# Patient Record
Sex: Male | Born: 1950 | Race: White | Hispanic: No | Marital: Married | Smoking: Former smoker
Health system: Southern US, Community
[De-identification: ages and names within clinical notes are randomized; demographics above are authoritative.]

## PROBLEM LIST (undated history)

## (undated) DIAGNOSIS — R269 Unspecified abnormalities of gait and mobility: Secondary | ICD-10-CM

## (undated) DIAGNOSIS — M542 Cervicalgia: Secondary | ICD-10-CM

## (undated) DIAGNOSIS — R51 Headache: Secondary | ICD-10-CM

## (undated) DIAGNOSIS — M545 Low back pain, unspecified: Secondary | ICD-10-CM

## (undated) DIAGNOSIS — F32A Depression, unspecified: Secondary | ICD-10-CM

## (undated) DIAGNOSIS — D649 Anemia, unspecified: Secondary | ICD-10-CM

## (undated) DIAGNOSIS — K599 Functional intestinal disorder, unspecified: Secondary | ICD-10-CM

## (undated) DIAGNOSIS — F419 Anxiety disorder, unspecified: Secondary | ICD-10-CM

## (undated) DIAGNOSIS — G8929 Other chronic pain: Secondary | ICD-10-CM

## (undated) DIAGNOSIS — R413 Other amnesia: Secondary | ICD-10-CM

## (undated) DIAGNOSIS — H9313 Tinnitus, bilateral: Secondary | ICD-10-CM

## (undated) DIAGNOSIS — H9192 Unspecified hearing loss, left ear: Secondary | ICD-10-CM

## (undated) DIAGNOSIS — M19079 Primary osteoarthritis, unspecified ankle and foot: Secondary | ICD-10-CM

## (undated) DIAGNOSIS — F329 Major depressive disorder, single episode, unspecified: Secondary | ICD-10-CM

## (undated) DIAGNOSIS — M1990 Unspecified osteoarthritis, unspecified site: Secondary | ICD-10-CM

## (undated) DIAGNOSIS — R410 Disorientation, unspecified: Secondary | ICD-10-CM

## (undated) HISTORY — DX: Depression, unspecified: F32.A

## (undated) HISTORY — PX: OTHER SURGICAL HISTORY: SHX169

## (undated) HISTORY — DX: Anemia, unspecified: D64.9

## (undated) HISTORY — DX: Low back pain: M54.5

## (undated) HISTORY — DX: Other chronic pain: G89.29

## (undated) HISTORY — DX: Tinnitus, bilateral: H93.13

## (undated) HISTORY — DX: Low back pain, unspecified: M54.50

## (undated) HISTORY — DX: Headache: R51

## (undated) HISTORY — DX: Unspecified hearing loss, left ear: H91.92

## (undated) HISTORY — DX: Unspecified abnormalities of gait and mobility: R26.9

## (undated) HISTORY — DX: Anxiety disorder, unspecified: F41.9

## (undated) HISTORY — DX: Other amnesia: R41.3

## (undated) HISTORY — DX: Major depressive disorder, single episode, unspecified: F32.9

## (undated) HISTORY — DX: Disorientation, unspecified: R41.0

## (undated) HISTORY — DX: Cervicalgia: M54.2

## (undated) HISTORY — DX: Unspecified osteoarthritis, unspecified site: M19.90

## (undated) HISTORY — DX: Functional intestinal disorder, unspecified: K59.9

## (undated) HISTORY — DX: Primary osteoarthritis, unspecified ankle and foot: M19.079

---

## 2006-07-22 ENCOUNTER — Ambulatory Visit: Payer: Self-pay | Admitting: Cardiology

## 2011-06-05 ENCOUNTER — Ambulatory Visit: Payer: BC Managed Care – PPO | Attending: Otolaryngology | Admitting: Audiology

## 2011-06-05 DIAGNOSIS — H903 Sensorineural hearing loss, bilateral: Secondary | ICD-10-CM | POA: Insufficient documentation

## 2011-09-03 ENCOUNTER — Ambulatory Visit (INDEPENDENT_AMBULATORY_CARE_PROVIDER_SITE_OTHER): Payer: BC Managed Care – PPO | Admitting: Psychology

## 2011-09-03 DIAGNOSIS — R413 Other amnesia: Secondary | ICD-10-CM

## 2011-09-03 DIAGNOSIS — F09 Unspecified mental disorder due to known physiological condition: Secondary | ICD-10-CM

## 2011-09-03 DIAGNOSIS — F32A Depression, unspecified: Secondary | ICD-10-CM

## 2011-09-03 DIAGNOSIS — F329 Major depressive disorder, single episode, unspecified: Secondary | ICD-10-CM

## 2011-09-24 ENCOUNTER — Encounter (HOSPITAL_COMMUNITY): Payer: Self-pay | Admitting: Psychology

## 2011-09-24 NOTE — Progress Notes (Signed)
Patient:   Kerry Ward   DOB:   [birth year]  MR Number:  [id number]  Location:  [facility]  [address] [place] [place], [place], [address]  Date of Service:   09/03/2011  Start Time:   3 PM End Time:   4 PM  Provider/Observer:  Arley Phenix, Psy.D.  Billing Code/Service:  [id number]  Reason for Service:  The patient was referred by [facility] and specifically Dr. Terrace Arabia for neuropsychological assessment. The patient is a 60 year old right-handed Caucasian male, presenting with 9 month history of gradual worsening of memory functioning, confusion, and difficulty concentrating. There is significant family stress as well as work related stress, depression, anxiety, but a normal mental status exam. There is a differential diagnostic considerations of a degenerative central nervous system condition versus depression versus combination of both. He was referred for a psychological evaluation. He is also scheduled to have an MRI of the brain but I do not have these records at this point.  The patient reports that 8 or 9 years ago that he left the country while working at [employer]. He reports he took some shots for this overseas trip and ever since his body reacts to everything. He has been diagnosed with a number of conditions to the years including multiple can chemical sensitivity and other problems related to this reactive situation. The patient reports that he has tinnitus so bad that he is losing his hearing in his left ear and feels dizziness all the time. The patient reports that his life has been so significantly impacted that he is depressed all the time and feels like doing nothing.  Current Status:  The patient reports continued difficulty with memory issues. He reports that he is essentially allergic to everything and cannot go anywhere in his life without having problems. Patient reports that he has allergic reactions to grass, trees, corn, weak, and despite every  other thing he is exposed to. The patient reports he moved here and got a job with 3 metallic chronic but has not been able to do these things because of this issue. 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 The patient reports that he does not seem nearly as many problems but his family member reports that there are some significant memory loss.   Behavioral Observation: Kerry Ward  presents as a 60 y.o.-year-old Right Caucasian  Male who appeared his stated age. his dress was Appropriate and he was Well Groomed and his manners were Appropriate to the situation.  There were not any physical disabilities noted.  he displayed an appropriate level of cooperation and motivation.    Interactions:    Active   Reliability of Information: Reliable information  Attention:   within normal limits  Memory:   within normal limits  Visuo-spatial:   within normal limits  Speech (Volume):  normal  Speech:   The patient's speech appeared to be articulate and was able to accurately communicate what he was attempting to describe.  Thought Process:  Coherent  Though Content:  WNL  Orientation:   person, place, time/date and situation  Judgment:   Good  Planning:   Good  Affect:    Depressed  Mood:    Depressed  Insight:    Good  Intelligence:   high  Marital Status/Living:  The patient lives at home with his wife. The patient has also lived with his wife and son. The patient was born in  [place] and grew up in [place]. He does have some siblings. His mother died of sepsis and his father died of a heart attack. Patient has been married to one woman and continues to be married to her. They have a 82 year old son and a 41 year old daughter.  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  Past Employment:  He worked for 45 years prior to this with never having any work related issues.  He has worked for motor all in the past. The patient reports that he is having some struggle with his current employment as he is having trouble with forgetfulness, slowed thinking, and been more careless.  Substance Use:  No concerns of substance abuse are reported.    Education:   College  Medical History:   Past Medical History  Diagnosis Date  . Depression   . Anxiety   . Anemia   . Arthritis   . Bowel dysfunction   . Headache   . Abnormality of gait   . Memory loss   . Confusion   . Lower back pain   . Neck pain, chronic     Sexual History:  The patient has been married for many years.  Abuse/Trauma History: There is no indication of any history of abuse or trauma.  Psychiatric History:  The patient does report that he saw someone while living in [place] for psychological issues but does not know who it was. He was treated with Cymbalta.  Family Med/Psych History: History reviewed. No pertinent family history.  Risk of Suicide/Violence: virtually non-existent   Impression/DX:  At this point, the patient does have persistent complaints of some memory difficulties but his family is reporting even more problems with memory, concentration, and problem solving. The patient also has issues related to apparent allergic reactions and possible multiple chemical sensitivities which he dates back to a shot he received 8 or 9 years ago when he was traveling out of the country while working at [employer]. The patient describes allergic reactions to almost every type of environmental allergens including grass, trees, corn, and weak. The patient reports a because of this he has difficulty going anywhere. The patient also reports constant tinnitus in between these cognitive difficulties, apparent allergic reactions, and tinnitus the patient is essentially left to do nothing but work whenever he can. He is essentially tearful about the situation he is in and concern that he may have some more  significant neurological condition as his mother was diagnosed with Alzheimer's at 62 years of age.  Disposition/Plan:  We will set the patient up for formal neuropsychological testing.  Diagnosis:    Axis I:   1. Cognitive disorder   2. Memory loss   3. Depression     Second  Axis I Dx if one:   Axis I: Rule out Organic Affective Syndrome        Axis II: No diagnosis       Axis III:  The patient has significant problems with tinnitus, apparent allergic reactions, and other medical issues.      Axis IV:  economic problems, occupational problems and other psychosocial or environmental problems          Axis V:  41-50 serious symptoms

## 2013-02-04 ENCOUNTER — Ambulatory Visit (HOSPITAL_COMMUNITY)
Admission: RE | Admit: 2013-02-04 | Discharge: 2013-02-04 | Disposition: A | Discharge status: Home / Self Care | Payer: Disability Insurance | Source: Ambulatory Visit | Attending: Radiology | Admitting: Radiology

## 2013-02-04 ENCOUNTER — Other Ambulatory Visit (HOSPITAL_COMMUNITY): Payer: Self-pay | Admitting: *Deleted

## 2013-02-04 ENCOUNTER — Ambulatory Visit (HOSPITAL_COMMUNITY)
Admission: RE | Admit: 2013-02-04 | Discharge: 2013-02-04 | Disposition: A | Discharge status: Home / Self Care | Payer: Disability Insurance | Source: Ambulatory Visit | Attending: *Deleted | Admitting: *Deleted

## 2013-02-04 DIAGNOSIS — M542 Cervicalgia: Secondary | ICD-10-CM | POA: Insufficient documentation

## 2013-02-04 DIAGNOSIS — G8929 Other chronic pain: Secondary | ICD-10-CM | POA: Insufficient documentation

## 2013-02-04 DIAGNOSIS — M545 Low back pain, unspecified: Secondary | ICD-10-CM | POA: Insufficient documentation

## 2017-06-13 DIAGNOSIS — M542 Cervicalgia: Secondary | ICD-10-CM | POA: Diagnosis not present

## 2017-06-13 DIAGNOSIS — M546 Pain in thoracic spine: Secondary | ICD-10-CM | POA: Diagnosis not present

## 2017-06-24 DIAGNOSIS — M542 Cervicalgia: Secondary | ICD-10-CM | POA: Diagnosis not present

## 2017-06-24 DIAGNOSIS — M546 Pain in thoracic spine: Secondary | ICD-10-CM | POA: Diagnosis not present

## 2017-06-25 DIAGNOSIS — M546 Pain in thoracic spine: Secondary | ICD-10-CM | POA: Diagnosis not present

## 2017-06-25 DIAGNOSIS — M542 Cervicalgia: Secondary | ICD-10-CM | POA: Diagnosis not present

## 2017-07-03 DIAGNOSIS — M542 Cervicalgia: Secondary | ICD-10-CM | POA: Diagnosis not present

## 2017-07-03 DIAGNOSIS — M546 Pain in thoracic spine: Secondary | ICD-10-CM | POA: Diagnosis not present

## 2017-07-07 DIAGNOSIS — M546 Pain in thoracic spine: Secondary | ICD-10-CM | POA: Diagnosis not present

## 2017-07-07 DIAGNOSIS — M542 Cervicalgia: Secondary | ICD-10-CM | POA: Diagnosis not present

## 2017-07-09 DIAGNOSIS — M546 Pain in thoracic spine: Secondary | ICD-10-CM | POA: Diagnosis not present

## 2017-07-09 DIAGNOSIS — M542 Cervicalgia: Secondary | ICD-10-CM | POA: Diagnosis not present

## 2017-07-11 DIAGNOSIS — M542 Cervicalgia: Secondary | ICD-10-CM | POA: Diagnosis not present

## 2017-07-11 DIAGNOSIS — M546 Pain in thoracic spine: Secondary | ICD-10-CM | POA: Diagnosis not present

## 2017-07-14 DIAGNOSIS — M546 Pain in thoracic spine: Secondary | ICD-10-CM | POA: Diagnosis not present

## 2017-07-14 DIAGNOSIS — M542 Cervicalgia: Secondary | ICD-10-CM | POA: Diagnosis not present

## 2017-07-17 DIAGNOSIS — M5030 Other cervical disc degeneration, unspecified cervical region: Secondary | ICD-10-CM | POA: Diagnosis not present

## 2017-07-17 DIAGNOSIS — M25511 Pain in right shoulder: Secondary | ICD-10-CM | POA: Diagnosis not present

## 2017-07-17 DIAGNOSIS — R42 Dizziness and giddiness: Secondary | ICD-10-CM | POA: Diagnosis not present

## 2017-07-17 DIAGNOSIS — M25512 Pain in left shoulder: Secondary | ICD-10-CM | POA: Diagnosis not present

## 2017-07-17 DIAGNOSIS — M549 Dorsalgia, unspecified: Secondary | ICD-10-CM | POA: Diagnosis not present

## 2017-07-17 DIAGNOSIS — R51 Headache: Secondary | ICD-10-CM | POA: Diagnosis not present

## 2017-07-25 DIAGNOSIS — S134XXA Sprain of ligaments of cervical spine, initial encounter: Secondary | ICD-10-CM | POA: Diagnosis not present

## 2017-07-25 DIAGNOSIS — M546 Pain in thoracic spine: Secondary | ICD-10-CM | POA: Diagnosis not present

## 2017-07-25 DIAGNOSIS — M542 Cervicalgia: Secondary | ICD-10-CM | POA: Diagnosis not present

## 2017-07-31 DIAGNOSIS — M5030 Other cervical disc degeneration, unspecified cervical region: Secondary | ICD-10-CM | POA: Diagnosis not present

## 2017-07-31 DIAGNOSIS — M542 Cervicalgia: Secondary | ICD-10-CM | POA: Diagnosis not present

## 2017-08-07 ENCOUNTER — Ambulatory Visit: Payer: Disability Insurance | Admitting: Family Medicine

## 2017-08-28 DIAGNOSIS — M542 Cervicalgia: Secondary | ICD-10-CM | POA: Diagnosis not present

## 2017-08-28 DIAGNOSIS — M546 Pain in thoracic spine: Secondary | ICD-10-CM | POA: Diagnosis not present

## 2017-09-15 ENCOUNTER — Ambulatory Visit (INDEPENDENT_AMBULATORY_CARE_PROVIDER_SITE_OTHER): Payer: Medicare Other | Admitting: Neurology

## 2017-09-15 ENCOUNTER — Encounter: Payer: Self-pay | Admitting: Neurology

## 2017-09-15 ENCOUNTER — Encounter (INDEPENDENT_AMBULATORY_CARE_PROVIDER_SITE_OTHER): Payer: Self-pay

## 2017-09-15 VITALS — Wt 174.6 lb

## 2017-09-15 DIAGNOSIS — M4802 Spinal stenosis, cervical region: Secondary | ICD-10-CM

## 2017-09-15 DIAGNOSIS — R29818 Other symptoms and signs involving the nervous system: Secondary | ICD-10-CM

## 2017-09-15 DIAGNOSIS — M542 Cervicalgia: Secondary | ICD-10-CM

## 2017-09-15 DIAGNOSIS — H9192 Unspecified hearing loss, left ear: Secondary | ICD-10-CM

## 2017-09-15 DIAGNOSIS — R29898 Other symptoms and signs involving the musculoskeletal system: Secondary | ICD-10-CM

## 2017-09-15 DIAGNOSIS — G1229 Other motor neuron disease: Secondary | ICD-10-CM

## 2017-09-15 DIAGNOSIS — R42 Dizziness and giddiness: Secondary | ICD-10-CM | POA: Diagnosis not present

## 2017-09-15 DIAGNOSIS — G959 Disease of spinal cord, unspecified: Secondary | ICD-10-CM | POA: Diagnosis not present

## 2017-09-15 DIAGNOSIS — M62562 Muscle wasting and atrophy, not elsewhere classified, left lower leg: Secondary | ICD-10-CM

## 2017-09-15 DIAGNOSIS — I639 Cerebral infarction, unspecified: Secondary | ICD-10-CM | POA: Diagnosis not present

## 2017-09-15 DIAGNOSIS — G1221 Amyotrophic lateral sclerosis: Secondary | ICD-10-CM | POA: Diagnosis not present

## 2017-09-15 MED ORDER — SCOPOLAMINE 1 MG/3DAYS TD PT72: 1.0000 | MEDICATED_PATCH | TRANSDERMAL | 0 refills | Status: DC

## 2017-09-15 MED ORDER — SCOPOLAMINE 1 MG/3DAYS TD PT72: 1.0000 | MEDICATED_PATCH | TRANSDERMAL | 12 refills | Status: DC
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
## 2017-09-15 NOTE — Patient Instructions (Addendum)
MRI brain and cervical spine Labs Emg/ncs after workup Vestibular Therapy Scopolamine patch 0.5 patch or 1 whole patch every 3 days  Post-Concussion Syndrome Post-concussion syndrome describes the symptoms that can occur after a head injury. These symptoms can last from weeks to months. What are the causes? It is not clear why some head injuries cause post-concussion syndrome. It can occur whether your head injury was mild or severe and whether you were wearing head protection or not. What are the signs or symptoms?  Memory difficulties.  Dizziness.  Headaches.  Double vision or blurry vision.  Sensitivity to light.  Hearing difficulties.  Depression.  Tiredness.  Weakness.  Difficulty with concentration.  Difficulty sleeping or staying asleep.  Vomiting.  Poor balance or instability on your feet.  Slow reaction time.  Difficulty learning and remembering things you have heard. How is this diagnosed? There is no test to determine whether you have post-concussion syndrome. Your health care provider may order an imaging scan of your brain, such as a CT scan, to check for other problems that may be causing your symptoms (such as a severe injury inside your skull). How is this treated? Usually, these problems disappear over time without medical care. Your health care provider may prescribe medicine to help ease your symptoms. It is important to follow up with a neurologist to evaluate your recovery and address any lingering symptoms or issues. Follow these instructions at home:  Take medicines only as directed by your health care provider. Do not take aspirin. Aspirin can slow blood clotting.  Sleep with your head slightly elevated to help with headaches.  Avoid any situation where there is potential for another head injury. This includes football, hockey, soccer, basketball, martial arts, downhill snow sports, and horseback riding. Your condition will get worse every  time you experience a concussion. You should avoid these activities until you are evaluated by the appropriate follow-up health care providers.  Keep all follow-up visits as directed by your health care provider. This is important. Contact a health care provider if:  You have increased problems paying attention or concentrating.  You have increased difficulty remembering or learning new information.  You need more time to complete tasks or assignments than before.  You have increased irritability or decreased ability to cope with stress.  You have more symptoms than before. Seek medical care if you have any of the following symptoms for more than two weeks after your injury:  Lasting (chronic) headaches.  Dizziness or balance problems.  Nausea.  Vision problems.  Increased sensitivity to noise or light.  Depression or mood swings.  Anxiety or irritability.  Memory problems.  Difficulty concentrating or paying attention.  Sleep problems.  Feeling tired all the time.  Get help right away if:  You have confusion or unusual drowsiness.  Others find it difficult to wake you up.  You have nausea or persistent, forceful vomiting.  You feel like you are moving when you are not (vertigo). Your eyes may move rapidly back and forth.  You have convulsions or faint.  You have severe, persistent headaches that are not relieved by medicine.  You cannot use your arms or legs normally.  One of your pupils is larger than the other.  You have clear or bloody discharge from your nose or ears.  Your problems are getting worse, not better. This information is not intended to replace advice given to you by your health care provider. Make sure you discuss any questions you have  with your health care provider. Document Released: 04/26/2002 Document Revised: 05/24/2016 Document Reviewed: 02/09/2014 Elsevier Interactive Patient Education  2018 [employer].   Orthostatic  Hypotension Orthostatic hypotension is a sudden drop in blood pressure that happens when you quickly change positions, such as when you get up from a seated or lying position. Blood pressure is a measurement of how strongly, or weakly, your blood is pressing against the walls of your arteries. Arteries are blood vessels that carry blood from your heart throughout your body. When blood pressure is too low, you may not get enough blood to your brain or to the rest of your organs. This can cause weakness, light-headedness, rapid heartbeat, and fainting. This can last for just a few seconds or for up to a few minutes. Orthostatic hypotension is usually not a serious problem. However, if it happens frequently or gets worse, it may be a sign of something more serious. What are the causes? This condition may be caused by:  Sudden changes in posture, such as standing up quickly after you have been sitting or lying down.  Blood loss.  Loss of body fluids (dehydration).  Heart problems.  Hormone (endocrine) problems.  Pregnancy.  Severe infection.  Lack of certain nutrients.  Severe allergic reactions (anaphylaxis).  Certain medicines, such as blood pressure medicine or medicines that make the body lose excess fluids (diuretics). Sometimes, this condition can be caused by not taking medicine as directed, such as taking too much of a certain medicine.  What increases the risk? Certain factors can make you more likely to develop orthostatic hypotension, including:  Age. Risk increases as you get older.  Conditions that affect the heart or the central nervous system.  Taking certain medicines, such as blood pressure medicine or diuretics.  Being pregnant.  What are the signs or symptoms? Symptoms of this condition may include:  Weakness.  Light-headedness.  Dizziness.  Blurred vision.  Fatigue.  Rapid heartbeat.  Fainting, in severe cases.  How is this diagnosed? This  condition is diagnosed based on:  Your medical history.  Your symptoms.  Your blood pressure measurement. Your health care provider will check your blood pressure when you are: ? Lying down. ? Sitting. ? Standing.  A blood pressure reading is recorded as two numbers, such as "120 over 80" (or 120/80). The first ("top") number is called the systolic pressure. It is a measure of the pressure in your arteries as your heart beats. The second ("bottom") number is called the diastolic pressure. It is a measure of the pressure in your arteries when your heart relaxes between beats. Blood pressure is measured in a unit called mm Hg. Healthy blood pressure for adults is 120/80. If your blood pressure is below 90/60, you may be diagnosed with hypotension. Other information or tests that may be used to diagnose orthostatic hypotension include:  Your other vital signs, such as your heart rate and temperature.  Blood tests.  Tilt table test. For this test, you will be safely secured to a table that moves you from a lying position to an upright position. Your heart rhythm and blood pressure will be monitored during the test.  How is this treated? Treatment for this condition may include:  Changing your diet. This may involve eating more salt (sodium) or drinking more water.  Taking medicines to raise your blood pressure.  Changing the dosage of certain medicines you are taking that might be lowering your blood pressure.  Wearing compression stockings. These stockings  help to prevent blood clots and reduce swelling in your legs.  In some cases, you may need to go to the hospital for:  Fluid replacement. This means you will receive fluids through an IV tube.  Blood replacement. This means you will receive donated blood through an IV tube (transfusion).  Treating an infection or heart problems, if this applies.  Monitoring. You may need to be monitored while medicines that you are taking wear  off.  Follow these instructions at home: Eating and drinking   Drink enough fluid to keep your urine clear or pale yellow.  Eat a healthy diet and follow instructions from your health care provider about eating or drinking restrictions. A healthy diet includes: ? Fresh fruits and vegetables. ? Whole grains. ? Lean meats. ? Low-fat dairy products.  Eat extra salt only as directed. Do not add extra salt to your diet unless your health care provider told you to do that.  Eat frequent, small meals.  Avoid standing up suddenly after eating. Medicines  Take over-the-counter and prescription medicines only as told by your health care provider. ? Follow instructions from your health care provider about changing the dosage of your current medicines, if this applies. ? Do not stop or adjust any of your medicines on your own. General instructions  Wear compression stockings as told by your health care provider.  Get up slowly from lying down or sitting positions. This gives your blood pressure a chance to adjust.  Avoid hot showers and excessive heat as directed by your health care provider.  Return to your normal activities as told by your health care provider. Ask your health care provider what activities are safe for you.  Do not use any products that contain nicotine or tobacco, such as cigarettes and e-cigarettes. If you need help quitting, ask your health care provider.  Keep all follow-up visits as told by your health care provider. This is important. Contact a health care provider if:  You vomit.  You have diarrhea.  You have a fever for more than 2-3 days.  You feel more thirsty than usual.  You feel weak and tired. Get help right away if:  You have chest pain.  You have a fast or irregular heartbeat.  You develop numbness in any part of your body.  You cannot move your arms or your legs.  You have trouble speaking.  You become sweaty or feel  lightheaded.  You faint.  You feel short of breath.  You have trouble staying awake.  You feel confused. This information is not intended to replace advice given to you by your health care provider. Make sure you discuss any questions you have with your health care provider. Document Released: 10/25/2002 Document Revised: 07/23/2016 Document Reviewed: 04/26/2016 Elsevier Interactive Patient Education  2018 [employer].  Scopolamine skin patches What is this medicine? SCOPOLAMINE (skoe POL a meen) is used to prevent nausea and vomiting caused by motion sickness, anesthesia and surgery. This medicine may be used for other purposes; ask your health care provider or pharmacist if you have questions. COMMON BRAND NAME(S): Transderm Scop What should I tell my health care provider before I take this medicine? They need to know if you have any of these conditions: -glaucoma -kidney or liver disease -an unusual or allergic reaction (especially skin allergy) to scopolamine, atropine, other medicines, foods, dyes, or preservatives -pregnant or trying to get pregnant -breast-feeding How should I use this medicine? This medicine is for external use  only. Follow the directions on the prescription label. One patch contains enough medicine to prevent motion sickness for up to 3 days. Apply the patch at least 4 hours before you need it and only wear one disc at a time. Choose an area behind the ear, that is clean, dry, hairless and free from any cuts or irritation. Wipe the area with a clean dry tissue. Peel off the plastic backing of the skin patch, trying not to touch the adhesive side with your hands. Do not cut the patches. Firmly apply to the area you have chosen, with the metallic side of the patch to the skin and the tan-colored side showing. Once firmly in place, wash your hands well with soap and water. Remove the disc after 3 days, or sooner if you no longer need it. After removing the patch,  wash your hands and the area behind your ear thoroughly with soap and water. The patch will still contain some medicine after use. To avoid accidental contact or ingestion by children or pets, fold the used patch in half with the sticky side together and throw away in the trash out of the reach of children and pets. If you need to use a second patch after you remove the first, place it behind the other ear. Talk to your pediatrician regarding the use of this medicine in children. Special care may be needed. Overdosage: If you think you have taken too much of this medicine contact a poison control center or emergency room at once. NOTE: This medicine is only for you. Do not share this medicine with others. What if I miss a dose? Make sure you apply the patch at least 4 hours before you need it. You can apply it the night before traveling. What may interact with this medicine? -benztropine -bethanechol -medicines for anxiety or sleeping problems like diazepam or temazepam -medicines for hay fever and other allergies -medicines for mental depression -muscle relaxants This list may not describe all possible interactions. Give your health care provider a list of all the medicines, herbs, non-prescription drugs, or dietary supplements you use. Also tell them if you smoke, drink alcohol, or use illegal drugs. Some items may interact with your medicine. What should I watch for while using this medicine? Keep the patch dry, if possible, to prevent it from falling off. Limited contact with water, however, as in bathing or swimming, will not affect the system. If the patch falls off, throw it away and put a new one behind the other ear. You may get drowsy or dizzy. Do not drive, use machinery, or do anything that needs mental alertness until you know how this medicine affects you. Do not stand or sit up quickly, especially if you are an older patient. This reduces the risk of dizzy or fainting spells. Alcohol  may interfere with the effect of this medicine. Avoid alcoholic drinks. Your mouth may get dry. Chewing sugarless gum or sucking hard candy, and drinking plenty of water may help. Contact your doctor if the problem does not go away or is severe. This medicine may cause dry eyes and blurred vision. If you wear contact lenses you may feel some discomfort. Lubricating drops may help. See your eye doctor if the problem does not go away or is severe. If you are going to have a magnetic resonance imaging (MRI) procedure, tell your MRI technician if you have this patch on your body. It must be removed before a MRI. What side effects may I  notice from receiving this medicine? Side effects that you should report to your doctor or health care professional as soon as possible: -agitation, nervousness, confusion -blurred vision and other eye problems -dizziness, drowsiness -eye pain or redness in the whites of the eye -hallucinations -pain or difficulty passing urine -skin rash, itching -vomiting Side effects that usually do not require medical attention (report to your doctor or health care professional if they continue or are bothersome): -headache -nausea This list may not describe all possible side effects. Call your doctor for medical advice about side effects. You may report side effects to FDA at 1-800-FDA-1088. Where should I keep my medicine? Keep out of the reach of children. Store at room temperature between 20 and 25 degrees C (68 and 77 degrees F). Throw away any unused medicine after the expiration date. When you remove a patch, fold it and throw it in the trash as described above. NOTE: This sheet is a summary. It may not cover all possible information. If you have questions about this medicine, talk to your doctor, pharmacist, or health care provider.  2018 Elsevier/Gold Standard (2012-04-02 13:31:48)

## 2017-09-16 ENCOUNTER — Encounter: Payer: Self-pay | Admitting: Neurology

## 2017-09-16 ENCOUNTER — Telehealth: Payer: Self-pay | Admitting: *Deleted

## 2017-09-16 DIAGNOSIS — R42 Dizziness and giddiness: Secondary | ICD-10-CM | POA: Insufficient documentation

## 2017-09-16 LAB — COMPREHENSIVE METABOLIC PANEL
A/G RATIO: 1.8 (ref 1.2–2.2)
ALBUMIN: 4.8 g/dL (ref 3.6–4.8)
ALT: 40 IU/L (ref 0–44)
AST: 30 IU/L (ref 0–40)
Alkaline Phosphatase: 92 IU/L (ref 39–117)
BUN/Creatinine Ratio: 14 (ref 10–24)
BUN: 13 mg/dL (ref 8–27)
Bilirubin Total: 0.8 mg/dL (ref 0.0–1.2)
CALCIUM: 9.7 mg/dL (ref 8.6–10.2)
CO2: 21 mmol/L (ref 20–29)
CREATININE: 0.9 mg/dL (ref 0.76–1.27)
Chloride: 99 mmol/L (ref 96–106)
GFR, EST AFRICAN AMERICAN: 101 mL/min/{1.73_m2} (ref >59)
GFR, EST NON AFRICAN AMERICAN: 87 mL/min/{1.73_m2} (ref >59)
GLOBULIN, TOTAL: 2.7 g/dL (ref 1.5–4.5)
Glucose: 82 mg/dL (ref 65–99)
POTASSIUM: 3.9 mmol/L (ref 3.5–5.2)
SODIUM: 138 mmol/L (ref 134–144)
TOTAL PROTEIN: 7.5 g/dL (ref 6.0–8.5)

## 2017-09-16 LAB — CK: CK TOTAL: 118 U/L (ref 24–204)

## 2017-09-16 NOTE — Telephone Encounter (Signed)
Called pharmacy to inquire why Scopolamine patch unavailable. They say it is unavailable because the manufacturer has discontinued the medication.

## 2017-09-16 NOTE — Telephone Encounter (Signed)
Called and informed pt that his labs are normal. He verbalized understanding. He informed me that [facility] said that Scopolamine patch is unavailable (Rx sent on 09/15/17). He also said that Dr. Nelva Bush has requested for Dr. Jaynee Eagles to call him. I told him I would inform Dr. Jaynee Eagles.

## 2017-09-16 NOTE — Telephone Encounter (Signed)
-----   Message from Melvenia Beam, MD sent at 09/16/2017  1:04 PM EDT ----- Labs normal thanks

## 2017-09-16 NOTE — Telephone Encounter (Signed)
Can you call the pharmacy and see why it is unavailable? Do they need to order it?

## 2017-09-17 ENCOUNTER — Telehealth: Payer: Self-pay | Admitting: *Deleted

## 2017-09-17 ENCOUNTER — Other Ambulatory Visit: Payer: Self-pay | Admitting: Neurology

## 2017-09-17 MED ORDER — ONDANSETRON 4 MG PO TBDP: ORAL_TABLET | ORAL | 1 refills | Status: DC

## 2017-09-17 MED ORDER — ONDANSETRON HCL 4 MG PO TABS: 4.0000 mg | ORAL_TABLET | Freq: Three times a day (TID) | ORAL | 0 refills | Status: DC | PRN

## 2017-09-17 NOTE — Telephone Encounter (Signed)
That's interesting, I guess you can but it over the counter now, tell him to look for it over the counter. But I will call in Zofran tid for the dizziness. When did Dr Nelva Bush say to call him? I sent my note, did he see dr Nelva Bush yesterday?

## 2017-09-17 NOTE — Telephone Encounter (Signed)
Called and spoke with pt's wife Magda Paganini (on [place]) and discussed zofran prescription written for patient. Also told her that they can look for Scopolamine patch OTC and can consult their pharmacist for any questions. She verbalized understanding. She also stated that she did not know if Dr. Nelva Bush expected a call but assumed that sending notes should be fine.

## 2017-09-17 NOTE — Telephone Encounter (Signed)
Received a PA for Zofran ODT. Per Dr. Jaynee Eagles, change to oral tablets. Order was written for oral tablets and sent to [facility].

## 2017-09-18 ENCOUNTER — Telehealth: Payer: Self-pay

## 2017-09-18 NOTE — Telephone Encounter (Signed)
I received an prior auth request for ondansetron tablets. I have completed and submitted the PA and should have a determination within 48-72 hours.  CoverMyMeds Key: [id number]

## 2017-09-20 ENCOUNTER — Ambulatory Visit
Admission: RE | Admit: 2017-09-20 | Discharge: 2017-09-20 | Disposition: A | Discharge status: Home / Self Care | Payer: Medicare Other | Source: Ambulatory Visit | Attending: Neurology | Admitting: Neurology

## 2017-09-20 DIAGNOSIS — M542 Cervicalgia: Secondary | ICD-10-CM

## 2017-09-20 DIAGNOSIS — M62562 Muscle wasting and atrophy, not elsewhere classified, left lower leg: Secondary | ICD-10-CM

## 2017-09-20 DIAGNOSIS — R42 Dizziness and giddiness: Secondary | ICD-10-CM | POA: Diagnosis not present

## 2017-09-20 DIAGNOSIS — H9192 Unspecified hearing loss, left ear: Secondary | ICD-10-CM

## 2017-09-20 DIAGNOSIS — R29818 Other symptoms and signs involving the nervous system: Secondary | ICD-10-CM

## 2017-09-20 DIAGNOSIS — G959 Disease of spinal cord, unspecified: Secondary | ICD-10-CM

## 2017-09-20 DIAGNOSIS — R29898 Other symptoms and signs involving the musculoskeletal system: Secondary | ICD-10-CM

## 2017-09-20 DIAGNOSIS — I639 Cerebral infarction, unspecified: Secondary | ICD-10-CM

## 2017-09-20 DIAGNOSIS — G1221 Amyotrophic lateral sclerosis: Secondary | ICD-10-CM

## 2017-09-20 DIAGNOSIS — M4802 Spinal stenosis, cervical region: Secondary | ICD-10-CM

## 2017-09-20 DIAGNOSIS — G1229 Other motor neuron disease: Secondary | ICD-10-CM

## 2017-09-20 MED ORDER — GADOBENATE DIMEGLUMINE 529 MG/ML IV SOLN
15.0000 mL | Freq: Once | INTRAVENOUS | Status: AC | PRN
  Administered 2017-09-20: 15 mL via INTRAVENOUS

## 2017-09-22 NOTE — Telephone Encounter (Signed)
Can you find out how much it is out of pocket?

## 2017-09-22 NOTE — Telephone Encounter (Signed)
I called [facility], they said that with a discount card for Ondansetron it is $276.86, without the card it is over $400.

## 2017-09-22 NOTE — Telephone Encounter (Signed)
Received a determination that Ondansetron has been denied d/t "Vertigo, post concussion syndrome" not being considered a "medically-accepted indication".

## 2017-09-23 ENCOUNTER — Telehealth: Payer: Self-pay | Admitting: *Deleted

## 2017-09-23 ENCOUNTER — Other Ambulatory Visit: Payer: Self-pay | Admitting: Neurology

## 2017-09-23 MED ORDER — MECLIZINE HCL 25 MG PO TABS: 25.0000 mg | ORAL_TABLET | Freq: Three times a day (TID) | ORAL | 1 refills | Status: AC | PRN

## 2017-09-23 NOTE — Telephone Encounter (Signed)
Prescribed meclizine, if he has already taken it in the past recommend phenergan thanks

## 2017-09-26 ENCOUNTER — Telehealth: Payer: Self-pay | Admitting: *Deleted

## 2017-09-26 NOTE — Telephone Encounter (Signed)
Called and LVM asking for call back.   I will be discussing his MRI results

## 2017-09-26 NOTE — Telephone Encounter (Signed)
-----   Message from Melvenia Beam, MD sent at 09/22/2017  1:20 PM EST ----- MRI of the brain unremarkable. The MRI of his cervical spine showed diffuse arthritc changes and several levels where nerve roots may be pinched but no central spinal canal stenosis which is good as the spinal cord is normal. Dr. Nelva Bush can use this MRI of the cervical spine to do epidural shots or other pain procedures. Not sure if he wants to see someone at [facility] for eval of surgery, he has had evaluations in the past.

## 2017-09-29 ENCOUNTER — Other Ambulatory Visit: Payer: Self-pay | Admitting: Neurology

## 2017-09-29 ENCOUNTER — Telehealth: Payer: Self-pay | Admitting: *Deleted

## 2017-09-29 NOTE — Telephone Encounter (Addendum)
Called and spoke w/ pt's wife Magda Paganini (on [place]). She verbalized understanding of all of Dr. Cathren Laine result comments from the MRI of brain & C-spine. However, she was concerned because they were told that the patient had severe stenosis on the CT, and now the MRI is showing a different result. She wants Dr. Cathren Laine thoughts on this. She said he does want surgery evaluation. He was diagnosed years ago with Radiculopathy and was told that the only real relief would be from surgery. Now his pain is worse. I told her I would send message to Dr. Jaynee Eagles.    She also verbalized understanding that prescription for Meclizine was ordered. She does not think the patient has filled it yet.    MRI of the brain unremarkable. The MRI of his cervical spine showed diffuse arthritc changes and several levels where nerve roots may be pinched but no central spinal canal stenosis which is good as the spinal cord is normal. Dr. Nelva Bush can use this MRI of the cervical spine to do epidural shots or other pain procedures. Not sure if he wants to see someone at [facility] for eval of surgery, he has had evaluations in the past.

## 2017-09-29 NOTE — Telephone Encounter (Signed)
Called pt's wife Magda Paganini, LVM asking for call back.   I will be clarifying:   Per Dr. 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 Also, she recommends he see Dr. Rolena Infante at [facility] for evaluation. Bring a CD with images.

## 2017-09-29 NOTE — Telephone Encounter (Signed)
He goes to [facility], I would see Dr, Rolena Infante over there for evaluation. Bring a CD with the images. thanks

## 2017-09-30 NOTE — Telephone Encounter (Signed)
Error encounter. 

## 2017-10-01 NOTE — Telephone Encounter (Signed)
Was able to get in touch with Kerry Ward (on [facility]) to clarify that per Kerry Ward, stenosis is in the foramen where the nerves pass through but there is no central spinal canal stenosis on MRI. Also, she recommends Kerry Ward at [facility] for eval. They can get a CD from GI to take to his office. She verbalized understanding and will pass information along to Kerry Ward. Told her that if Kerry Ward had ANY questions to please call. She verbalized appreciation for the call.

## 2017-10-21 DIAGNOSIS — M542 Cervicalgia: Secondary | ICD-10-CM | POA: Diagnosis not present

## 2017-10-21 DIAGNOSIS — S134XXD Sprain of ligaments of cervical spine, subsequent encounter: Secondary | ICD-10-CM | POA: Diagnosis not present

## 2017-11-06 DIAGNOSIS — M542 Cervicalgia: Secondary | ICD-10-CM | POA: Diagnosis not present

## 2017-11-06 DIAGNOSIS — S134XXD Sprain of ligaments of cervical spine, subsequent encounter: Secondary | ICD-10-CM | POA: Diagnosis not present

## 2017-11-27 DIAGNOSIS — M546 Pain in thoracic spine: Secondary | ICD-10-CM | POA: Diagnosis not present

## 2017-11-27 DIAGNOSIS — M542 Cervicalgia: Secondary | ICD-10-CM | POA: Diagnosis not present

## 2017-11-27 DIAGNOSIS — S134XXD Sprain of ligaments of cervical spine, subsequent encounter: Secondary | ICD-10-CM | POA: Diagnosis not present

## 2017-12-01 DIAGNOSIS — M542 Cervicalgia: Secondary | ICD-10-CM | POA: Diagnosis not present

## 2017-12-01 DIAGNOSIS — M546 Pain in thoracic spine: Secondary | ICD-10-CM | POA: Diagnosis not present

## 2017-12-01 DIAGNOSIS — S134XXD Sprain of ligaments of cervical spine, subsequent encounter: Secondary | ICD-10-CM | POA: Diagnosis not present

## 2017-12-05 DIAGNOSIS — S134XXD Sprain of ligaments of cervical spine, subsequent encounter: Secondary | ICD-10-CM | POA: Diagnosis not present

## 2017-12-05 DIAGNOSIS — M542 Cervicalgia: Secondary | ICD-10-CM | POA: Diagnosis not present

## 2017-12-05 DIAGNOSIS — M546 Pain in thoracic spine: Secondary | ICD-10-CM | POA: Diagnosis not present

## 2017-12-16 ENCOUNTER — Ambulatory Visit (INDEPENDENT_AMBULATORY_CARE_PROVIDER_SITE_OTHER): Payer: Medicare Other | Admitting: Neurology

## 2017-12-16 ENCOUNTER — Encounter: Payer: Self-pay | Admitting: Neurology

## 2017-12-16 VITALS — BP 134/78 | HR 83 | Ht 65.0 in | Wt 182.0 lb

## 2017-12-16 DIAGNOSIS — M542 Cervicalgia: Secondary | ICD-10-CM | POA: Diagnosis not present

## 2017-12-16 DIAGNOSIS — R51 Headache: Secondary | ICD-10-CM | POA: Diagnosis not present

## 2017-12-16 DIAGNOSIS — G4486 Cervicogenic headache: Secondary | ICD-10-CM

## 2017-12-16 DIAGNOSIS — M5481 Occipital neuralgia: Secondary | ICD-10-CM | POA: Diagnosis not present

## 2017-12-16 NOTE — Patient Instructions (Signed)
Occipital Neuralgia Occipital neuralgia is a type of headache that causes episodes of very bad pain in the back of your head. Pain from occipital neuralgia may spread (radiate) to other parts of your head. The pain is usually brief and often goes away after you rest and relax. These headaches may be caused by irritation of the nerves that leave your spinal cord high up in your neck, just below the base of your skull (occipital nerves). Your occipital nerves transmit sensations from the back of your head, the top of your head, and the areas behind your ears. What are the causes? Occipital neuralgia can occur without any known cause (primary headache syndrome). In other cases, occipital neuralgia is caused by pressure on or irritation of one of the two occipital nerves. Causes of occipital nerve compression or irritation include:  Wear and tear of the vertebrae in the neck (osteoarthritis).  Neck injury.  Disease of the disks that separate the vertebrae.  Tumors.  Gout.  Infections.  Diabetes.  Swollen blood vessels that put pressure on the occipital nerves.  Muscle spasm in the neck.  What are the signs or symptoms? Pain is the main symptom of occipital neuralgia. It usually starts in the back of the head but may also be felt in other areas supplied by the occipital nerves. Pain is usually on one side but may be on both sides. You may have:  Brief episodes of very bad pain that is burning, stabbing, shocking, or shooting.  Pain behind the eye.  Pain triggered by neck movement or hair brushing.  Scalp tenderness.  Aching in the back of the head between episodes of very bad pain.  How is this diagnosed? Your health care provider may diagnose occipital neuralgia based on your symptoms and a physical exam. During the exam, the health care provider may push on areas supplied by the occipital nerves to see if they are painful. Some tests may also be done to help in making the  diagnosis. These may include:  Imaging studies of the upper spinal cord, such as an MRI or CT scan. These may show compression or spinal cord abnormalities.  Nerve block. You will get an injection of numbing medicine (local anesthetic) near the occipital nerve to see if this relieves pain.  How is this treated? Treatment may begin with simple measures, such as:  Rest.  Massage.  Heat.  Over-the-counter pain relievers.  If these measures do not work, you may need other treatments, including:  Medicines such as: ? Prescription-strength anti-inflammatory medicines. ? Muscle relaxants. ? Antiseizure medicines. ? Antidepressants.  Steroid injection. This involves injections of local anesthetic and strong anti-inflammatory drugs (steroids).  Pulsed radiofrequency. Wires are implanted to deliver electrical impulses that block pain signals from the occipital nerve.  Physical therapy.  Surgery to relieve nerve pressure.  Follow these instructions at home:  Take all medicines as directed by your health care provider.  Avoid activities that cause pain.  Rest when you have an attack of pain.  Try gentle massage or a heating pad to relieve pain.  Work with a physical therapist to learn stretching exercises you can do at home.  Try a different pillow or sleeping position.  Practice good posture.  Try to stay active. Get regular exercise that does not cause pain. Ask your health care provider to suggest safe exercises for you.  Keep all follow-up visits as directed by your health care provider. This is important. Contact a health care provider if:    Your medicine is not working.  You have new or worsening symptoms. Get help right away if:  You have very bad head pain that is not going away.  You have a sudden change in vision, balance, or speech. This information is not intended to replace advice given to you by your health care provider. Make sure you discuss any  questions you have with your health care provider. Document Released: 10/29/2001 Document Revised: 04/11/2016 Document Reviewed: 10/27/2013 Elsevier Interactive Patient Education  2017 [employer]  
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
## 2018-01-02 DIAGNOSIS — Z01812 Encounter for preprocedural laboratory examination: Secondary | ICD-10-CM | POA: Diagnosis not present

## 2018-01-02 DIAGNOSIS — M542 Cervicalgia: Secondary | ICD-10-CM | POA: Diagnosis not present

## 2018-01-02 DIAGNOSIS — Z7901 Long term (current) use of anticoagulants: Secondary | ICD-10-CM | POA: Diagnosis not present

## 2018-01-05 DIAGNOSIS — M4722 Other spondylosis with radiculopathy, cervical region: Secondary | ICD-10-CM | POA: Diagnosis not present

## 2018-01-05 DIAGNOSIS — M542 Cervicalgia: Secondary | ICD-10-CM | POA: Diagnosis not present

## 2018-01-05 DIAGNOSIS — M5126 Other intervertebral disc displacement, lumbar region: Secondary | ICD-10-CM | POA: Diagnosis not present

## 2018-01-21 DIAGNOSIS — M542 Cervicalgia: Secondary | ICD-10-CM | POA: Diagnosis not present

## 2018-03-20 DIAGNOSIS — J302 Other seasonal allergic rhinitis: Secondary | ICD-10-CM | POA: Diagnosis not present

## 2018-03-20 DIAGNOSIS — M545 Low back pain: Secondary | ICD-10-CM | POA: Diagnosis not present

## 2018-03-20 DIAGNOSIS — Z299 Encounter for prophylactic measures, unspecified: Secondary | ICD-10-CM | POA: Diagnosis not present

## 2018-03-20 DIAGNOSIS — Z6828 Body mass index (BMI) 28.0-28.9, adult: Secondary | ICD-10-CM | POA: Diagnosis not present

## 2018-03-20 DIAGNOSIS — Z87891 Personal history of nicotine dependence: Secondary | ICD-10-CM | POA: Diagnosis not present

## 2018-03-20 DIAGNOSIS — G4700 Insomnia, unspecified: Secondary | ICD-10-CM | POA: Diagnosis not present

## 2018-05-13 DIAGNOSIS — D1800 Hemangioma unspecified site: Secondary | ICD-10-CM | POA: Diagnosis not present

## 2018-05-13 DIAGNOSIS — L821 Other seborrheic keratosis: Secondary | ICD-10-CM | POA: Diagnosis not present

## 2018-05-13 DIAGNOSIS — L570 Actinic keratosis: Secondary | ICD-10-CM | POA: Diagnosis not present

## 2018-07-02 DIAGNOSIS — D1801 Hemangioma of skin and subcutaneous tissue: Secondary | ICD-10-CM | POA: Diagnosis not present

## 2018-07-02 DIAGNOSIS — L570 Actinic keratosis: Secondary | ICD-10-CM | POA: Diagnosis not present

## 2018-07-02 DIAGNOSIS — L821 Other seborrheic keratosis: Secondary | ICD-10-CM | POA: Diagnosis not present

## 2018-07-02 DIAGNOSIS — D239 Other benign neoplasm of skin, unspecified: Secondary | ICD-10-CM | POA: Diagnosis not present

## 2018-07-14 DIAGNOSIS — Z1331 Encounter for screening for depression: Secondary | ICD-10-CM | POA: Diagnosis not present

## 2018-07-14 DIAGNOSIS — Z79899 Other long term (current) drug therapy: Secondary | ICD-10-CM | POA: Diagnosis not present

## 2018-07-14 DIAGNOSIS — Z7189 Other specified counseling: Secondary | ICD-10-CM | POA: Diagnosis not present

## 2018-07-14 DIAGNOSIS — Z1211 Encounter for screening for malignant neoplasm of colon: Secondary | ICD-10-CM | POA: Diagnosis not present

## 2018-07-14 DIAGNOSIS — R5383 Other fatigue: Secondary | ICD-10-CM | POA: Diagnosis not present

## 2018-07-14 DIAGNOSIS — M545 Low back pain: Secondary | ICD-10-CM | POA: Diagnosis not present

## 2018-07-14 DIAGNOSIS — Z125 Encounter for screening for malignant neoplasm of prostate: Secondary | ICD-10-CM | POA: Diagnosis not present

## 2018-07-14 DIAGNOSIS — Z1339 Encounter for screening examination for other mental health and behavioral disorders: Secondary | ICD-10-CM | POA: Diagnosis not present

## 2018-07-14 DIAGNOSIS — Z6827 Body mass index (BMI) 27.0-27.9, adult: Secondary | ICD-10-CM | POA: Diagnosis not present

## 2018-07-14 DIAGNOSIS — Z299 Encounter for prophylactic measures, unspecified: Secondary | ICD-10-CM | POA: Diagnosis not present

## 2018-07-14 DIAGNOSIS — Z0000 Encounter for general adult medical examination without abnormal findings: Secondary | ICD-10-CM | POA: Diagnosis not present

## 2018-07-25 ENCOUNTER — Emergency Department (HOSPITAL_COMMUNITY): Payer: Medicare Other

## 2018-07-25 ENCOUNTER — Emergency Department (HOSPITAL_COMMUNITY)
Admission: EM | Admit: 2018-07-25 | Discharge: 2018-07-25 | Disposition: A | Discharge status: Home / Self Care | Payer: Medicare Other | Attending: Emergency Medicine | Admitting: Emergency Medicine

## 2018-07-25 DIAGNOSIS — S4991XA Unspecified injury of right shoulder and upper arm, initial encounter: Secondary | ICD-10-CM | POA: Diagnosis not present

## 2018-07-25 DIAGNOSIS — Y939 Activity, unspecified: Secondary | ICD-10-CM | POA: Diagnosis not present

## 2018-07-25 DIAGNOSIS — Z87891 Personal history of nicotine dependence: Secondary | ICD-10-CM | POA: Insufficient documentation

## 2018-07-25 DIAGNOSIS — Z79899 Other long term (current) drug therapy: Secondary | ICD-10-CM | POA: Diagnosis not present

## 2018-07-25 DIAGNOSIS — Z7982 Long term (current) use of aspirin: Secondary | ICD-10-CM | POA: Insufficient documentation

## 2018-07-25 DIAGNOSIS — S50811A Abrasion of right forearm, initial encounter: Secondary | ICD-10-CM | POA: Insufficient documentation

## 2018-07-25 DIAGNOSIS — S82121A Displaced fracture of lateral condyle of right tibia, initial encounter for closed fracture: Secondary | ICD-10-CM | POA: Insufficient documentation

## 2018-07-25 DIAGNOSIS — Y999 Unspecified external cause status: Secondary | ICD-10-CM | POA: Diagnosis not present

## 2018-07-25 DIAGNOSIS — S43401A Unspecified sprain of right shoulder joint, initial encounter: Secondary | ICD-10-CM

## 2018-07-25 DIAGNOSIS — R457 State of emotional shock and stress, unspecified: Secondary | ICD-10-CM | POA: Diagnosis not present

## 2018-07-25 DIAGNOSIS — R0789 Other chest pain: Secondary | ICD-10-CM | POA: Diagnosis not present

## 2018-07-25 DIAGNOSIS — S46011A Strain of muscle(s) and tendon(s) of the rotator cuff of right shoulder, initial encounter: Secondary | ICD-10-CM | POA: Diagnosis not present

## 2018-07-25 DIAGNOSIS — M25511 Pain in right shoulder: Secondary | ICD-10-CM | POA: Diagnosis not present

## 2018-07-25 DIAGNOSIS — R52 Pain, unspecified: Secondary | ICD-10-CM | POA: Diagnosis not present

## 2018-07-25 DIAGNOSIS — I10 Essential (primary) hypertension: Secondary | ICD-10-CM | POA: Diagnosis not present

## 2018-07-25 DIAGNOSIS — R079 Chest pain, unspecified: Secondary | ICD-10-CM | POA: Insufficient documentation

## 2018-07-25 DIAGNOSIS — M25519 Pain in unspecified shoulder: Secondary | ICD-10-CM | POA: Diagnosis not present

## 2018-07-25 DIAGNOSIS — S299XXA Unspecified injury of thorax, initial encounter: Secondary | ICD-10-CM | POA: Diagnosis not present

## 2018-07-25 DIAGNOSIS — Y929 Unspecified place or not applicable: Secondary | ICD-10-CM | POA: Diagnosis not present

## 2018-07-25 DIAGNOSIS — S82141A Displaced bicondylar fracture of right tibia, initial encounter for closed fracture: Secondary | ICD-10-CM | POA: Diagnosis not present

## 2018-07-25 DIAGNOSIS — S8991XA Unspecified injury of right lower leg, initial encounter: Secondary | ICD-10-CM | POA: Diagnosis present

## 2018-07-25 MED ORDER — OXYCODONE HCL 5 MG PO TABS
5.0000 mg | ORAL_TABLET | Freq: Once | ORAL | Status: AC
  Administered 2018-07-25: 5 mg via ORAL
  Filled 2018-07-25: qty 1

## 2018-07-25 MED ORDER — BACITRACIN ZINC 500 UNIT/GM EX OINT
1.0000 "application " | TOPICAL_OINTMENT | Freq: Once | CUTANEOUS | Status: AC
  Administered 2018-07-25: 1 via TOPICAL
  Filled 2018-07-25: qty 0.9

## 2018-07-25 MED ORDER — OXYCODONE HCL 5 MG PO CAPS: 5.0000 mg | ORAL_CAPSULE | Freq: Four times a day (QID) | ORAL | 0 refills | Status: DC | PRN

## 2018-07-25 MED ORDER — TETANUS-DIPHTH-ACELL PERTUSSIS 5-2.5-18.5 LF-MCG/0.5 IM SUSP
0.5000 mL | Freq: Once | INTRAMUSCULAR | Status: DC
  Filled 2018-07-25: qty 0.5

## 2018-07-25 MED ORDER — OXYCODONE HCL 5 MG PO CAPS: 5.0000 mg | ORAL_CAPSULE | Freq: Four times a day (QID) | ORAL | 0 refills | Status: AC | PRN
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
## 2018-07-25 NOTE — ED Notes (Signed)
Patients wife refusing for patient to get Tdap, patient agreeing. Dr. Rex Kras aware.  Ice applied to R knee and R shoulder.

## 2018-07-25 NOTE — Discharge Instructions (Signed)
Do not bear any weight on your leg. Follow up with orthopedics. Keep knee in knee immobilizer. Return to ER if any numbness of foot or severe pain, breathing problems, chest pain, abdominal pain, or vision changes/headache.
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
## 2018-07-27 ENCOUNTER — Telehealth (INDEPENDENT_AMBULATORY_CARE_PROVIDER_SITE_OTHER): Payer: Self-pay | Admitting: Orthopaedic Surgery
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
## 2018-07-27 NOTE — Telephone Encounter (Signed)
Please advise on work note.

## 2018-07-27 NOTE — Telephone Encounter (Signed)
OK - thanks

## 2018-07-28 NOTE — Telephone Encounter (Signed)
Note emailed 

## 2018-07-29 ENCOUNTER — Telehealth (INDEPENDENT_AMBULATORY_CARE_PROVIDER_SITE_OTHER): Payer: Self-pay | Admitting: Orthopaedic Surgery

## 2018-07-29 NOTE — Telephone Encounter (Signed)
I left voicemail for [place]. I emailed note to address given and did not receive anything back stating it was not delivered.  I would like to confirm email address before emailing again.

## 2018-07-29 NOTE — Telephone Encounter (Signed)
Magda Paganini ( wife) called advised email was not received. She advised she spoke with patient's employer today. Magda Paganini asked if the note can be emailed again. The number to contact Magda Paganini is [phone]

## 2018-07-30 ENCOUNTER — Ambulatory Visit (INDEPENDENT_AMBULATORY_CARE_PROVIDER_SITE_OTHER): Payer: Medicare Other | Admitting: Orthopaedic Surgery

## 2018-07-30 NOTE — Telephone Encounter (Signed)
I called and spoke with patient. He states that everything has been received and taken care of.

## 2018-07-31 ENCOUNTER — Telehealth (INDEPENDENT_AMBULATORY_CARE_PROVIDER_SITE_OTHER): Payer: Self-pay | Admitting: Orthopaedic Surgery

## 2018-07-31 MED ORDER — OXYCODONE HCL 5 MG PO TABS: 5.0000 mg | ORAL_TABLET | Freq: Three times a day (TID) | ORAL | 0 refills | Status: AC | PRN

## 2018-07-31 NOTE — Telephone Encounter (Signed)
OK for oxycodone 5 mg  # 20   1po q 8 hrs prn pain. Thanks I will sign. Son to come by this afternoon after lunch and pick up  thanks

## 2018-07-31 NOTE — Telephone Encounter (Signed)
Script printed and at front for pick up.

## 2018-07-31 NOTE — Telephone Encounter (Signed)
Please advise. Patient was on the schedule yesterday in [place] office but did not keep appointment.  He does not have a follow up appointment scheduled.

## 2018-07-31 NOTE — Telephone Encounter (Signed)
Patient's wife Magda Paganini) called advised patient need Rx refilled (Oxycodone) The number to contact patient's wife is [phone]

## 2018-08-06 ENCOUNTER — Encounter (INDEPENDENT_AMBULATORY_CARE_PROVIDER_SITE_OTHER): Payer: Self-pay | Admitting: Orthopaedic Surgery

## 2018-08-06 ENCOUNTER — Ambulatory Visit (INDEPENDENT_AMBULATORY_CARE_PROVIDER_SITE_OTHER): Payer: Medicare Other | Admitting: Orthopaedic Surgery

## 2018-08-06 VITALS — BP 124/86 | HR 102 | Ht 64.0 in | Wt 182.0 lb

## 2018-08-06 DIAGNOSIS — S82141D Displaced bicondylar fracture of right tibia, subsequent encounter for closed fracture with routine healing: Secondary | ICD-10-CM | POA: Diagnosis not present

## 2018-08-06 MED ORDER — HYDROCODONE-ACETAMINOPHEN 5-325 MG PO TABS: 1.0000 | ORAL_TABLET | Freq: Three times a day (TID) | ORAL | 0 refills | Status: AC | PRN
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
## 2018-08-27 ENCOUNTER — Encounter (INDEPENDENT_AMBULATORY_CARE_PROVIDER_SITE_OTHER): Payer: Self-pay | Admitting: Orthopaedic Surgery

## 2018-08-27 ENCOUNTER — Ambulatory Visit (INDEPENDENT_AMBULATORY_CARE_PROVIDER_SITE_OTHER): Payer: Medicare Other

## 2018-08-27 ENCOUNTER — Ambulatory Visit (INDEPENDENT_AMBULATORY_CARE_PROVIDER_SITE_OTHER): Payer: Medicare Other | Admitting: Orthopaedic Surgery

## 2018-08-27 VITALS — BP 140/91 | HR 108 | Ht 64.0 in | Wt 182.0 lb

## 2018-08-27 DIAGNOSIS — S82141D Displaced bicondylar fracture of right tibia, subsequent encounter for closed fracture with routine healing: Secondary | ICD-10-CM | POA: Diagnosis not present

## 2018-08-27 DIAGNOSIS — S82141A Displaced bicondylar fracture of right tibia, initial encounter for closed fracture: Secondary | ICD-10-CM | POA: Insufficient documentation
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
## 2018-09-01 ENCOUNTER — Telehealth (INDEPENDENT_AMBULATORY_CARE_PROVIDER_SITE_OTHER): Payer: Self-pay | Admitting: Orthopaedic Surgery

## 2018-09-01 NOTE — Telephone Encounter (Signed)
Please advise 
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
## 2018-09-01 NOTE — Telephone Encounter (Signed)
Note entered and faxed. 

## 2018-09-01 NOTE — Telephone Encounter (Signed)
(  [phone] Please Fax   Patient wife called would like a note written stating when patient is expected to return to work.

## 2018-09-01 NOTE — Telephone Encounter (Signed)
I received vm from patients wife Magda Paganini. She stated patient needed ov note faxed to his employer. IC her back [phone] and lmvm, I advised that patient will have to sign authorization to release records before we can release anything. I did advise he can sign the release form at the [facility] office and they will forward it to me.

## 2018-09-02 ENCOUNTER — Telehealth (INDEPENDENT_AMBULATORY_CARE_PROVIDER_SITE_OTHER): Payer: Self-pay | Admitting: Orthopaedic Surgery

## 2018-09-02 NOTE — Telephone Encounter (Signed)
I received pts release of records form, but the date of record patient filled in was from 10/7 to 10/7 so I am unable to process. There was no DOS for 10/7. IC pt and lmvm advising pt of this that there was no DOS for 10/7. Stated that patient was last seen at [facility] office 10/10 and would need new AR form that was valid for the 10/10.

## 2018-09-03 NOTE — Telephone Encounter (Signed)
Patient call and gave verbal AR to change date to 10/10. Ov note and work note faxed

## 2018-09-30 DIAGNOSIS — Z6826 Body mass index (BMI) 26.0-26.9, adult: Secondary | ICD-10-CM | POA: Diagnosis not present

## 2018-09-30 DIAGNOSIS — Z2821 Immunization not carried out because of patient refusal: Secondary | ICD-10-CM | POA: Diagnosis not present

## 2018-09-30 DIAGNOSIS — R42 Dizziness and giddiness: Secondary | ICD-10-CM | POA: Diagnosis not present

## 2018-09-30 DIAGNOSIS — Z299 Encounter for prophylactic measures, unspecified: Secondary | ICD-10-CM | POA: Diagnosis not present

## 2018-10-01 ENCOUNTER — Ambulatory Visit (INDEPENDENT_AMBULATORY_CARE_PROVIDER_SITE_OTHER): Payer: Self-pay

## 2018-10-01 ENCOUNTER — Encounter (INDEPENDENT_AMBULATORY_CARE_PROVIDER_SITE_OTHER): Payer: Self-pay | Admitting: Orthopaedic Surgery

## 2018-10-01 ENCOUNTER — Ambulatory Visit (INDEPENDENT_AMBULATORY_CARE_PROVIDER_SITE_OTHER): Payer: Medicare Other | Admitting: Orthopaedic Surgery

## 2018-10-01 VITALS — BP 117/85 | HR 107 | Ht 64.0 in | Wt 182.0 lb

## 2018-10-01 DIAGNOSIS — S82141D Displaced bicondylar fracture of right tibia, subsequent encounter for closed fracture with routine healing: Secondary | ICD-10-CM

## 2018-10-01 NOTE — Progress Notes (Signed)
   Post-Op Visit Note   Patient: Kerry Ward           Date of Birth: 06/19/1951           MRN: [id number] Visit Date: 10/01/2018 PCP: Medicine, [facility]   Assessment & Plan: Patient is using a rolling walker with reverse seat and hand break.  He has a 4 pronged walker available since his wife has MS I recommend he use a 4 pronged walker since he is fallen twice.  He can gradually increase weightbearing discontinue Bledsoe brace.  Return 2 months no x-ray needed on return.  Progressive leg strengthening.  He is having problems with vertigo just had a carotid ultrasound.  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  Recheck with me 2 months.  Chief Complaint:  Chief Complaint  Patient presents with  . Right Knee - Fracture, Follow-up    DOI 07/25/18 Closed fracture of right tibial plateau   Visit Diagnoses:  1. Closed fracture of right tibial plateau with routine healing, subsequent encounter     Plan: Continue leg strengthening progressive weightbearing as tolerated.  Use 4-prong walker he is available then progressed to a cane.  Return 8 weeks.  No x-ray needed on return.  Follow-Up Instructions: No follow-ups on file.   Orders:  Orders Placed This Encounter  Procedures  . XR Knee 1-2 Views Right   No orders of the defined types were placed in this encounter.   Imaging: No results found.  PMFS History: Patient Active Problem List   Diagnosis Date Noted  . Closed fracture of right tibial plateau 08/27/2018  . Vertigo 09/16/2017   Past Medical History:  Diagnosis Date  . Abnormality of gait   . Anemia   . Anxiety   . Arthritis   . Bowel dysfunction   . Confusion   . Deafness in left ear   . Depression   . Headache(784.0)   . Lower back pain   . Memory loss   . Neck pain, chronic   . Osteoarthritis of subtalar joint   . 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 Diabetes  Mother   . Diabetes Father   . Psychosis Son        Latent Psychosis after TBI    Past Surgical History:  Procedure Laterality Date  . no surgical history     Social History   Occupational History  . Not on file  Tobacco Use  . Smoking status: Former Smoker    Last attempt to quit: 1980    Years since quitting: 39.8  . Smokeless tobacco: Never Used  Substance and Sexual Activity  . Alcohol use: Yes    Comment: rare  . Drug use: No  . Sexual activity: Not on file

## 2018-10-20 DIAGNOSIS — H81393 Other peripheral vertigo, bilateral: Secondary | ICD-10-CM | POA: Diagnosis not present

## 2018-10-20 DIAGNOSIS — I7389 Other specified peripheral vascular diseases: Secondary | ICD-10-CM | POA: Diagnosis not present

## 2018-10-20 DIAGNOSIS — G9009 Other idiopathic peripheral autonomic neuropathy: Secondary | ICD-10-CM | POA: Diagnosis not present

## 2018-11-02 DIAGNOSIS — Z299 Encounter for prophylactic measures, unspecified: Secondary | ICD-10-CM | POA: Diagnosis not present

## 2018-11-02 DIAGNOSIS — R42 Dizziness and giddiness: Secondary | ICD-10-CM | POA: Diagnosis not present

## 2018-11-02 DIAGNOSIS — Z6827 Body mass index (BMI) 27.0-27.9, adult: Secondary | ICD-10-CM | POA: Diagnosis not present

## 2018-11-23 DIAGNOSIS — R202 Paresthesia of skin: Secondary | ICD-10-CM | POA: Diagnosis not present

## 2018-11-23 DIAGNOSIS — R42 Dizziness and giddiness: Secondary | ICD-10-CM | POA: Diagnosis not present

## 2018-12-28 DIAGNOSIS — R42 Dizziness and giddiness: Secondary | ICD-10-CM | POA: Diagnosis not present

## 2018-12-28 DIAGNOSIS — Z6827 Body mass index (BMI) 27.0-27.9, adult: Secondary | ICD-10-CM | POA: Diagnosis not present

## 2018-12-28 DIAGNOSIS — H8110 Benign paroxysmal vertigo, unspecified ear: Secondary | ICD-10-CM | POA: Diagnosis not present

## 2018-12-28 DIAGNOSIS — Z87891 Personal history of nicotine dependence: Secondary | ICD-10-CM | POA: Diagnosis not present

## 2018-12-28 DIAGNOSIS — Z299 Encounter for prophylactic measures, unspecified: Secondary | ICD-10-CM | POA: Diagnosis not present

## 2019-01-04 ENCOUNTER — Telehealth (INDEPENDENT_AMBULATORY_CARE_PROVIDER_SITE_OTHER): Payer: Self-pay | Admitting: Orthopaedic Surgery

## 2019-01-04 NOTE — Telephone Encounter (Signed)
Ok thanks 

## 2019-01-04 NOTE — Telephone Encounter (Signed)
Please advise 

## 2019-01-04 NOTE — Telephone Encounter (Signed)
Patient wife called her husband is ready to return to work, his wife stated he is not returning to the same job he just needed a note stating he is ok to be released. Please call patient when note is ready

## 2019-01-04 NOTE — Telephone Encounter (Signed)
I left voicemail for return call. 

## 2019-01-05 NOTE — Telephone Encounter (Signed)
Note printed and at front for pick up. I left voicemail for patient advising.

## 2019-01-25 ENCOUNTER — Ambulatory Visit (INDEPENDENT_AMBULATORY_CARE_PROVIDER_SITE_OTHER): Payer: Medicare Other | Admitting: Otolaryngology

## 2019-01-25 DIAGNOSIS — R42 Dizziness and giddiness: Secondary | ICD-10-CM

## 2019-01-25 DIAGNOSIS — H903 Sensorineural hearing loss, bilateral: Secondary | ICD-10-CM | POA: Diagnosis not present

## 2019-01-28 DIAGNOSIS — R42 Dizziness and giddiness: Secondary | ICD-10-CM | POA: Diagnosis not present

## 2019-01-29 DIAGNOSIS — Z299 Encounter for prophylactic measures, unspecified: Secondary | ICD-10-CM | POA: Diagnosis not present

## 2019-01-29 DIAGNOSIS — M25571 Pain in right ankle and joints of right foot: Secondary | ICD-10-CM | POA: Diagnosis not present

## 2019-01-29 DIAGNOSIS — M85871 Other specified disorders of bone density and structure, right ankle and foot: Secondary | ICD-10-CM | POA: Diagnosis not present

## 2019-01-29 DIAGNOSIS — M7989 Other specified soft tissue disorders: Secondary | ICD-10-CM | POA: Diagnosis not present

## 2019-01-29 DIAGNOSIS — S82831S Other fracture of upper and lower end of right fibula, sequela: Secondary | ICD-10-CM | POA: Diagnosis not present

## 2019-01-29 DIAGNOSIS — Z6827 Body mass index (BMI) 27.0-27.9, adult: Secondary | ICD-10-CM | POA: Diagnosis not present

## 2019-02-04 ENCOUNTER — Other Ambulatory Visit: Payer: Self-pay

## 2019-02-04 ENCOUNTER — Ambulatory Visit (INDEPENDENT_AMBULATORY_CARE_PROVIDER_SITE_OTHER): Payer: Medicare Other | Admitting: Orthopaedic Surgery

## 2019-02-04 ENCOUNTER — Encounter (INDEPENDENT_AMBULATORY_CARE_PROVIDER_SITE_OTHER): Payer: Self-pay | Admitting: Orthopaedic Surgery

## 2019-02-04 VITALS — Ht 65.0 in | Wt 178.0 lb

## 2019-02-04 DIAGNOSIS — H832X2 Labyrinthine dysfunction, left ear: Secondary | ICD-10-CM | POA: Diagnosis not present

## 2019-02-04 DIAGNOSIS — M25571 Pain in right ankle and joints of right foot: Secondary | ICD-10-CM

## 2019-02-04 DIAGNOSIS — R42 Dizziness and giddiness: Secondary | ICD-10-CM | POA: Diagnosis not present
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
## 2019-03-22 DIAGNOSIS — Z713 Dietary counseling and surveillance: Secondary | ICD-10-CM | POA: Diagnosis not present

## 2019-03-22 DIAGNOSIS — Z299 Encounter for prophylactic measures, unspecified: Secondary | ICD-10-CM | POA: Diagnosis not present

## 2019-03-22 DIAGNOSIS — M545 Low back pain: Secondary | ICD-10-CM | POA: Diagnosis not present

## 2019-03-22 DIAGNOSIS — Z6827 Body mass index (BMI) 27.0-27.9, adult: Secondary | ICD-10-CM | POA: Diagnosis not present
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
## 2022-03-04 DIAGNOSIS — R6884 Jaw pain: Secondary | ICD-10-CM | POA: Diagnosis not present

## 2022-03-04 DIAGNOSIS — R42 Dizziness and giddiness: Secondary | ICD-10-CM | POA: Diagnosis not present

## 2022-03-04 DIAGNOSIS — Z880 Allergy status to penicillin: Secondary | ICD-10-CM | POA: Diagnosis not present

## 2022-03-04 DIAGNOSIS — R079 Chest pain, unspecified: Secondary | ICD-10-CM | POA: Diagnosis not present

## 2022-03-06 DIAGNOSIS — R079 Chest pain, unspecified: Secondary | ICD-10-CM | POA: Diagnosis not present

## 2022-03-06 DIAGNOSIS — E7849 Other hyperlipidemia: Secondary | ICD-10-CM | POA: Diagnosis not present

## 2022-03-06 DIAGNOSIS — Z0000 Encounter for general adult medical examination without abnormal findings: Secondary | ICD-10-CM | POA: Diagnosis not present

## 2022-03-06 DIAGNOSIS — E559 Vitamin D deficiency, unspecified: Secondary | ICD-10-CM | POA: Diagnosis not present

## 2022-03-06 DIAGNOSIS — R5383 Other fatigue: Secondary | ICD-10-CM | POA: Diagnosis not present

## 2022-03-15 DIAGNOSIS — R079 Chest pain, unspecified: Secondary | ICD-10-CM | POA: Diagnosis not present

## 2022-04-17 ENCOUNTER — Encounter: Payer: Self-pay | Admitting: *Deleted

## 2022-07-24 DIAGNOSIS — Z880 Allergy status to penicillin: Secondary | ICD-10-CM | POA: Diagnosis not present

## 2022-07-24 DIAGNOSIS — K760 Fatty (change of) liver, not elsewhere classified: Secondary | ICD-10-CM | POA: Diagnosis not present

## 2022-07-24 DIAGNOSIS — Z886 Allergy status to analgesic agent status: Secondary | ICD-10-CM | POA: Diagnosis not present

## 2022-07-24 DIAGNOSIS — Z8719 Personal history of other diseases of the digestive system: Secondary | ICD-10-CM | POA: Diagnosis not present

## 2022-07-24 DIAGNOSIS — K859 Acute pancreatitis without necrosis or infection, unspecified: Secondary | ICD-10-CM | POA: Diagnosis not present

## 2022-07-24 DIAGNOSIS — Z885 Allergy status to narcotic agent status: Secondary | ICD-10-CM | POA: Diagnosis not present

## 2022-07-24 DIAGNOSIS — Z609 Problem related to social environment, unspecified: Secondary | ICD-10-CM | POA: Diagnosis not present

## 2022-07-24 DIAGNOSIS — K4020 Bilateral inguinal hernia, without obstruction or gangrene, not specified as recurrent: Secondary | ICD-10-CM | POA: Diagnosis not present

## 2022-07-24 DIAGNOSIS — R109 Unspecified abdominal pain: Secondary | ICD-10-CM | POA: Diagnosis not present

## 2022-07-24 DIAGNOSIS — N281 Cyst of kidney, acquired: Secondary | ICD-10-CM | POA: Diagnosis not present

## 2022-07-24 DIAGNOSIS — K8500 Idiopathic acute pancreatitis without necrosis or infection: Secondary | ICD-10-CM | POA: Diagnosis not present

## 2022-07-24 DIAGNOSIS — R1013 Epigastric pain: Secondary | ICD-10-CM | POA: Diagnosis not present

## 2022-07-24 DIAGNOSIS — K5900 Constipation, unspecified: Secondary | ICD-10-CM | POA: Diagnosis present

## 2022-09-23 ENCOUNTER — Encounter: Payer: Self-pay | Admitting: *Deleted

## 2022-12-23 DIAGNOSIS — I10 Essential (primary) hypertension: Secondary | ICD-10-CM | POA: Diagnosis not present

## 2022-12-23 DIAGNOSIS — L500 Allergic urticaria: Secondary | ICD-10-CM | POA: Diagnosis not present

## 2023-04-26 DIAGNOSIS — Z6828 Body mass index (BMI) 28.0-28.9, adult: Secondary | ICD-10-CM | POA: Diagnosis not present

## 2023-04-26 DIAGNOSIS — E663 Overweight: Secondary | ICD-10-CM | POA: Diagnosis not present

## 2023-04-26 DIAGNOSIS — M7032 Other bursitis of elbow, left elbow: Secondary | ICD-10-CM | POA: Diagnosis not present

## 2023-10-06 DIAGNOSIS — T1592XA Foreign body on external eye, part unspecified, left eye, initial encounter: Secondary | ICD-10-CM | POA: Diagnosis not present

## 2023-10-06 DIAGNOSIS — Z6826 Body mass index (BMI) 26.0-26.9, adult: Secondary | ICD-10-CM | POA: Diagnosis not present

## 2023-10-06 DIAGNOSIS — R030 Elevated blood-pressure reading, without diagnosis of hypertension: Secondary | ICD-10-CM | POA: Diagnosis not present

## 2023-10-06 DIAGNOSIS — E663 Overweight: Secondary | ICD-10-CM | POA: Diagnosis not present

## 2023-10-07 DIAGNOSIS — T1590XA Foreign body on external eye, part unspecified, unspecified eye, initial encounter: Secondary | ICD-10-CM | POA: Diagnosis not present

## 2023-10-07 DIAGNOSIS — I10 Essential (primary) hypertension: Secondary | ICD-10-CM | POA: Diagnosis not present

## 2024-04-05 DIAGNOSIS — Z6826 Body mass index (BMI) 26.0-26.9, adult: Secondary | ICD-10-CM | POA: Diagnosis not present

## 2024-04-05 DIAGNOSIS — I10 Essential (primary) hypertension: Secondary | ICD-10-CM | POA: Diagnosis not present

## 2024-04-05 DIAGNOSIS — Z0000 Encounter for general adult medical examination without abnormal findings: Secondary | ICD-10-CM | POA: Diagnosis not present

## 2024-04-09 DIAGNOSIS — Z125 Encounter for screening for malignant neoplasm of prostate: Secondary | ICD-10-CM | POA: Diagnosis not present

## 2024-04-09 DIAGNOSIS — I10 Essential (primary) hypertension: Secondary | ICD-10-CM | POA: Diagnosis not present

## 2024-04-09 DIAGNOSIS — Z0000 Encounter for general adult medical examination without abnormal findings: Secondary | ICD-10-CM | POA: Diagnosis not present

## 2024-04-30 DIAGNOSIS — Z87891 Personal history of nicotine dependence: Secondary | ICD-10-CM | POA: Diagnosis not present

## 2024-04-30 DIAGNOSIS — Z136 Encounter for screening for cardiovascular disorders: Secondary | ICD-10-CM | POA: Diagnosis not present

## 2024-06-02 ENCOUNTER — Ambulatory Visit: Admitting: Urology

## 2024-06-02 DIAGNOSIS — R972 Elevated prostate specific antigen [PSA]: Secondary | ICD-10-CM

## 2024-10-06 DIAGNOSIS — Z125 Encounter for screening for malignant neoplasm of prostate: Secondary | ICD-10-CM | POA: Diagnosis not present

## 2024-10-06 DIAGNOSIS — I10 Essential (primary) hypertension: Secondary | ICD-10-CM | POA: Diagnosis not present

## 2024-10-06 DIAGNOSIS — Z6826 Body mass index (BMI) 26.0-26.9, adult: Secondary | ICD-10-CM | POA: Diagnosis not present

## 2024-10-06 DIAGNOSIS — Z0000 Encounter for general adult medical examination without abnormal findings: Secondary | ICD-10-CM | POA: Diagnosis not present
